# Patient Record
Sex: Male | Born: 2000
Health system: Southern US, Community
[De-identification: ages and names within clinical notes are randomized; demographics above are authoritative.]

## PROBLEM LIST (undated history)

## (undated) DIAGNOSIS — R42 Dizziness and giddiness: Secondary | ICD-10-CM

## (undated) DIAGNOSIS — F909 Attention-deficit hyperactivity disorder, unspecified type: Secondary | ICD-10-CM

## (undated) HISTORY — DX: Attention-deficit hyperactivity disorder, unspecified type: F90.9

## (undated) HISTORY — DX: Dizziness and giddiness: R42

---
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
## 2018-10-06 DIAGNOSIS — S43402A Unspecified sprain of left shoulder joint, initial encounter: Secondary | ICD-10-CM | POA: Diagnosis not present

## 2018-10-23 DIAGNOSIS — S46819A Strain of other muscles, fascia and tendons at shoulder and upper arm level, unspecified arm, initial encounter: Secondary | ICD-10-CM | POA: Diagnosis not present

## 2018-10-23 DIAGNOSIS — M545 Low back pain: Secondary | ICD-10-CM | POA: Diagnosis not present

## 2018-12-29 DIAGNOSIS — R509 Fever, unspecified: Secondary | ICD-10-CM | POA: Diagnosis not present

## 2018-12-29 DIAGNOSIS — R591 Generalized enlarged lymph nodes: Secondary | ICD-10-CM | POA: Diagnosis not present

## 2019-01-01 DIAGNOSIS — R509 Fever, unspecified: Secondary | ICD-10-CM | POA: Diagnosis not present

## 2019-01-01 DIAGNOSIS — R591 Generalized enlarged lymph nodes: Secondary | ICD-10-CM | POA: Diagnosis not present

## 2019-01-31 IMAGING — CT CT CHEST W/ CM
2 of 5 series · 12 of 36 positions shown, 15 images · IV contrast (Omni 300)
Comparison: None.

CLINICAL DATA: Level 1 trauma. Patient fell from skateboard.
Bleeding from the left ear and nose.

EXAM:
CT CHEST, ABDOMEN, AND PELVIS WITH CONTRAST
TECHNIQUE: Multidetector CT imaging of the chest, abdomen and pelvis was
performed following the standard protocol during bolus
administration of intravenous contrast.
CONTRAST:  100 mL Isovue 370

[Series 1: cap with 5mm st · axial · 0.77mm/px · z∈[-879,-304]mm · 9 of 141 slices shown, 12 images]
[im 13/141  mediastinal]
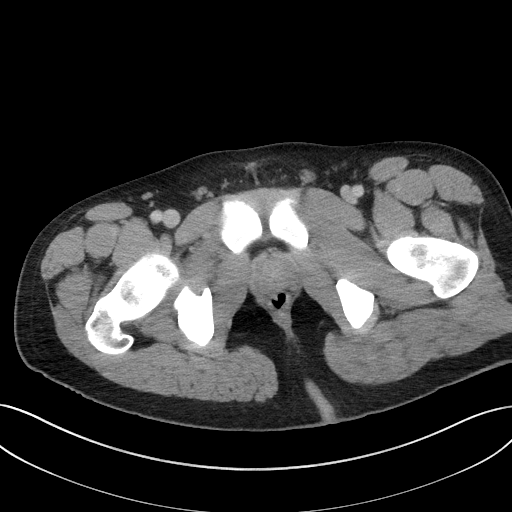
[im 13/141  lung]
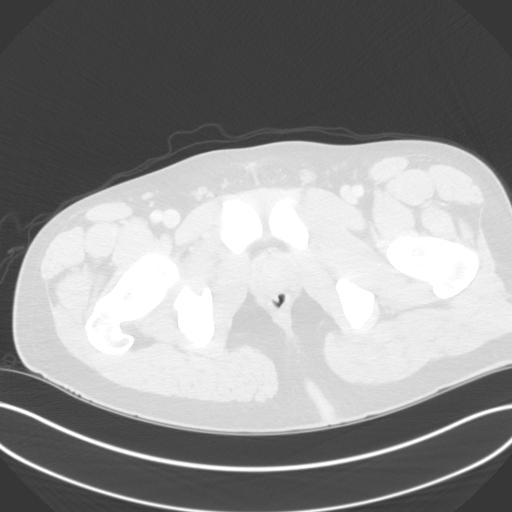
[im 26/141  lung]
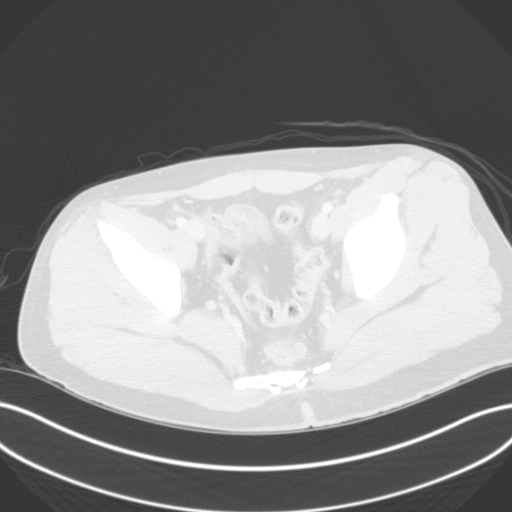
[im 39/141  lung]
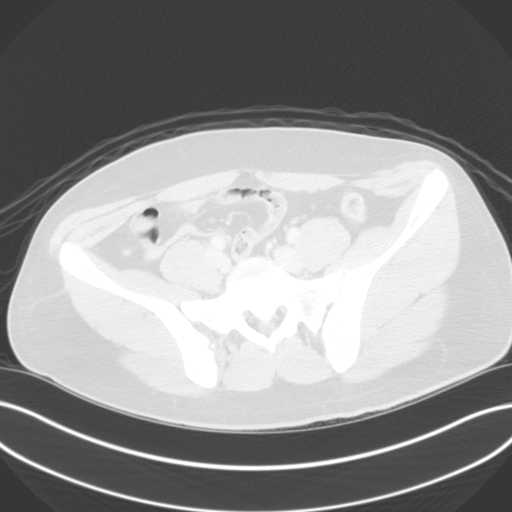
[im 51/141  lung]
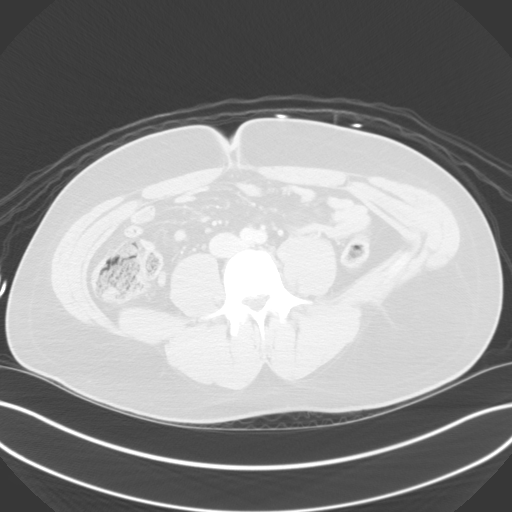
[im 77/141  mediastinal]
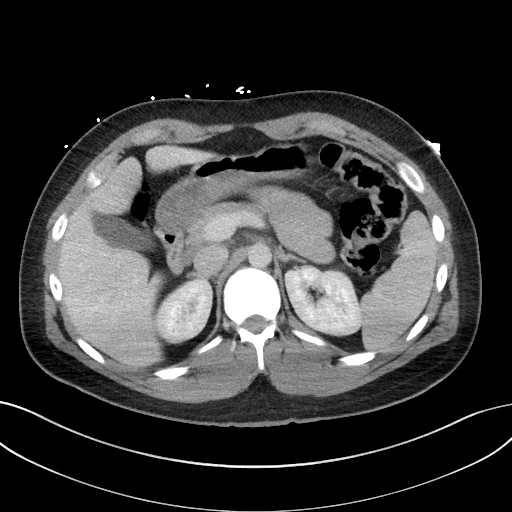
[im 77/141  lung]
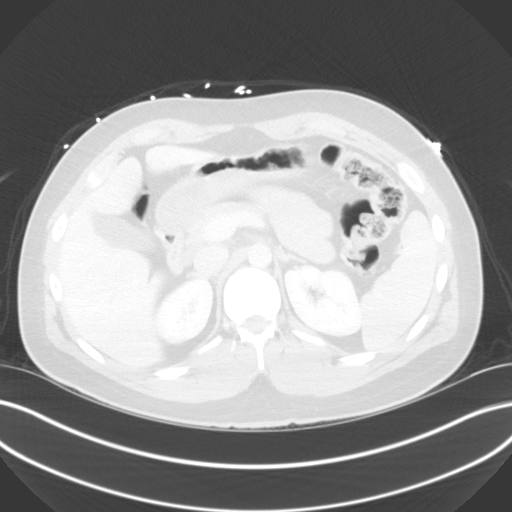
[im 90/141  lung]
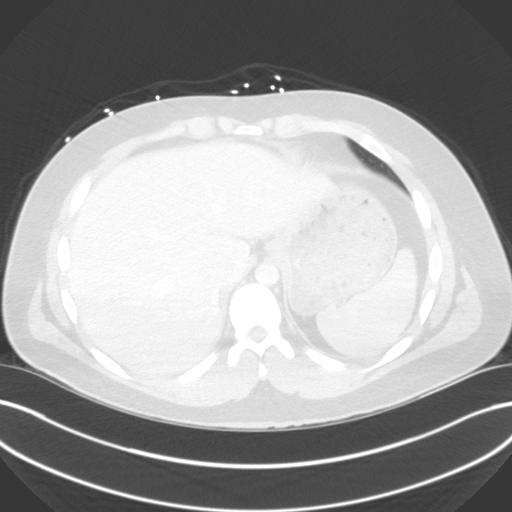
[im 102/141  lung]
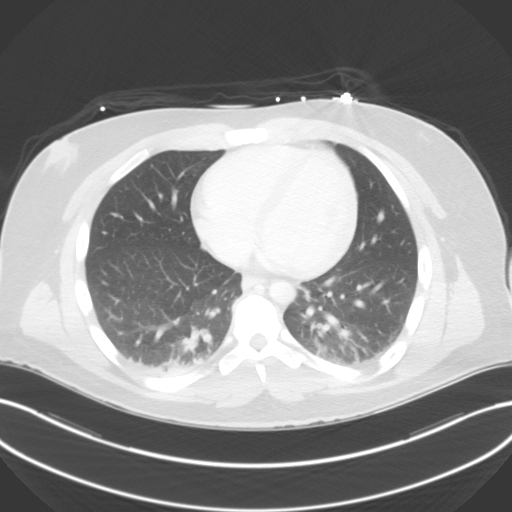
[im 115/141  lung]
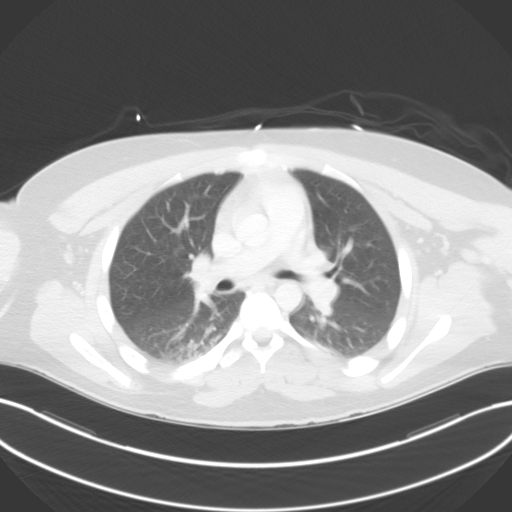
[im 128/141  mediastinal]
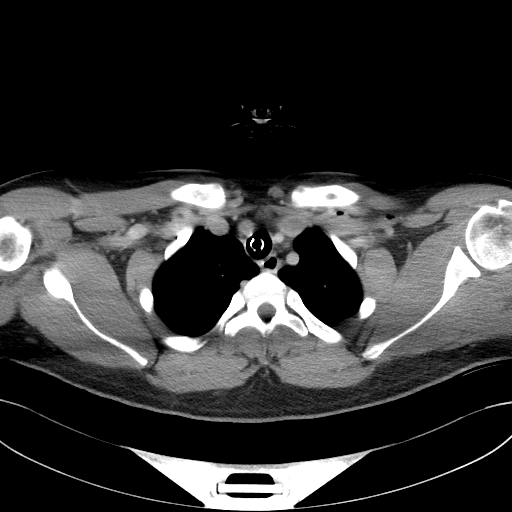
[im 128/141  lung]
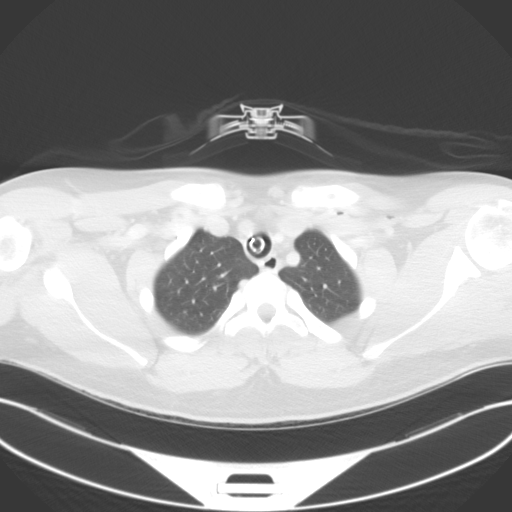

[Series 5: cap with 3mm st cor · coronal · 0.71mm/px · 3 of 140 slices shown]
[im 28/140  lung]
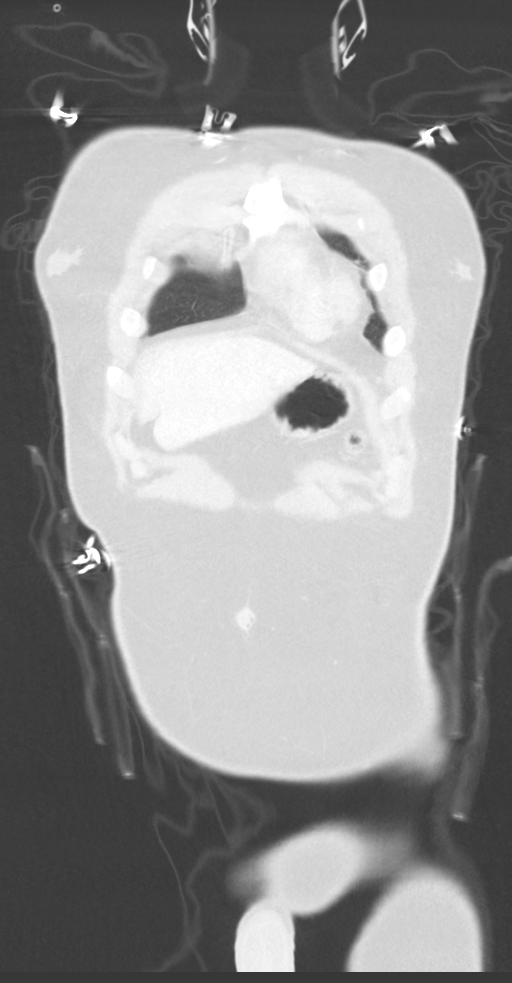
[im 56/140  lung]
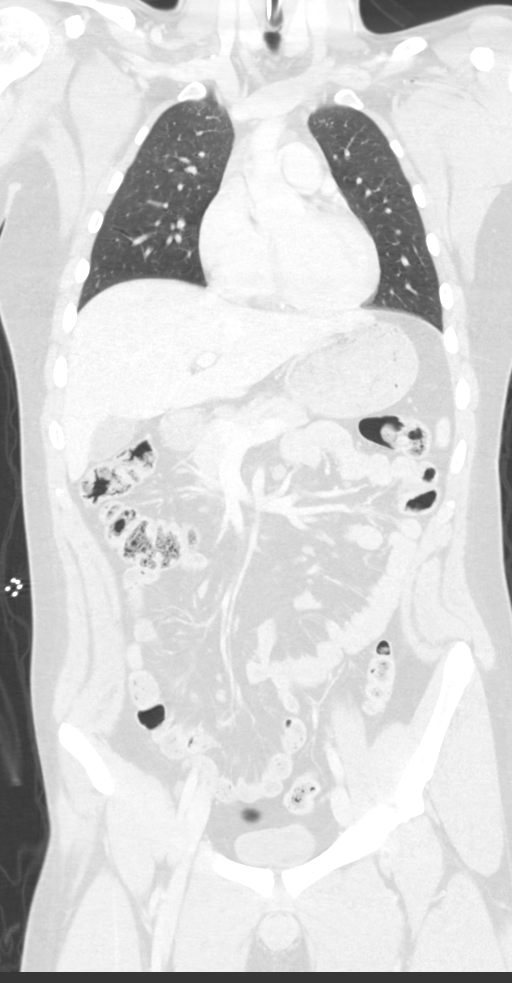
[im 84/140  lung]
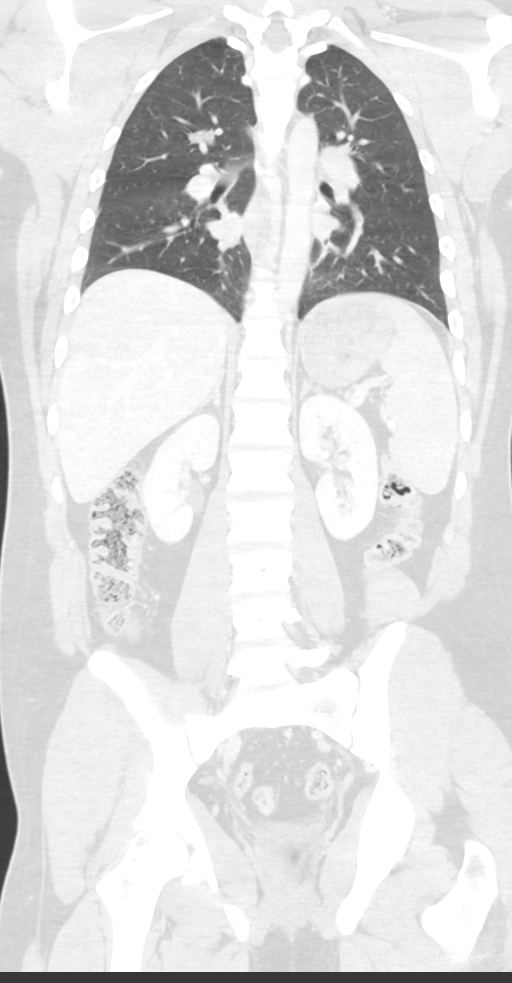

[12 of 36 positions shown; findings below may reference images not displayed]

FINDINGS: CT CHEST FINDINGS

Cardiovascular: Normal heart size. No pericardial effusion. Normal
caliber thoracic aorta. Contrast bolus is limited but no evidence of
aortic injury. Venous gas in the left subclavian region likely
results from intravenous injections.

Mediastinum/Nodes: Increased density in the mediastinum likely
representing residual thymic tissue. No abnormal mediastinal gas or
hematoma. Esophagus is decompressed. No significant lymphadenopathy.
Endotracheal tube tip is above the carina.

Lungs/Pleura: Vague patchy airspace disease demonstrated in the
right upper lung with mild consolidation and atelectasis in the lung
bases. These changes could indicate pulmonary contusion or
aspiration. No pleural effusions. No pneumothorax. Airways are
patent.

Musculoskeletal: Normal alignment of the thoracic spine. No
vertebral compression deformities. No depressed rib or sternal
fractures.

CT ABDOMEN PELVIS FINDINGS

Hepatobiliary: No hepatic injury or perihepatic hematoma.
Gallbladder is unremarkable

Pancreas: Unremarkable. No pancreatic ductal dilatation or
surrounding inflammatory changes.

Spleen: No splenic injury or perisplenic hematoma.

Adrenals/Urinary Tract: No adrenal hemorrhage or renal injury
identified. Bladder is unremarkable.

Stomach/Bowel: Stomach is within normal limits. Appendix appears
normal. No evidence of bowel wall thickening, distention, or
inflammatory changes.

Vascular/Lymphatic: No significant vascular findings are present. No
enlarged abdominal or pelvic lymph nodes.

Reproductive: Prostate is unremarkable.

Other: No free air or free fluid in the abdomen. No mesenteric or
retroperitoneal hematomas. Abdominal wall musculature appears
intact.

Musculoskeletal: Normal alignment of the lumbar spine. No vertebral
compression deformities. Sacrum, pelvis, and hips appear intact.
IMPRESSION: 1. Mild patchy airspace disease in the right upper lung with
atelectasis or consolidation in both lung bases. In the setting of
trauma, this could represent pulmonary contusion or aspiration.
2. No acute mediastinal injury.
3. No acute posttraumatic changes demonstrated in the abdomen or
pelvis. No evidence of solid organ injury or bowel perforation.
4. No acute fractures identified.

## 2019-02-03 IMAGING — CT CT HEAD W/O CM
4 series · 15 of 47 positions shown, 17 images · non-contrast
Comparison: CT HEAD May 13, 2017 and CT face May 13, 2017

CLINICAL DATA: Followup head trauma and hearing loss. Known LEFT
temporal skull fracture.

EXAM:
CT HEAD WITHOUT CONTRAST
TECHNIQUE: Contiguous axial images were obtained from the base of the skull
through the vertex without intravenous contrast.

[Series 3: head wo · axial · 0.40mm/px · z∈[+209,+319]mm · 7 of 30 slices shown, 9 images]
[im 4/30  brain]
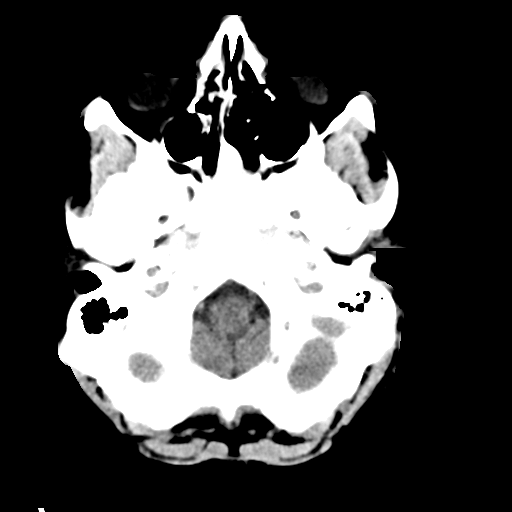
[im 4/30  bone]
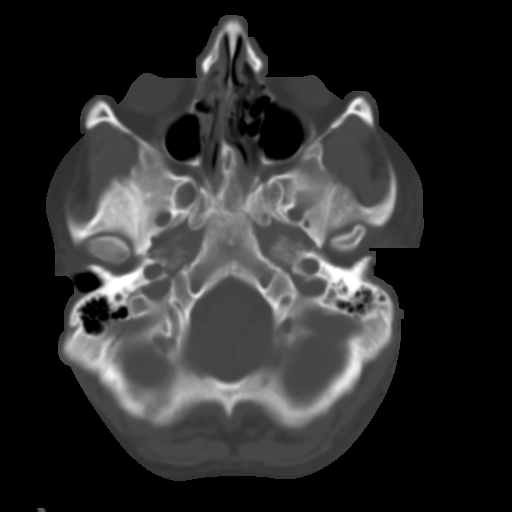
[im 8/30  brain]
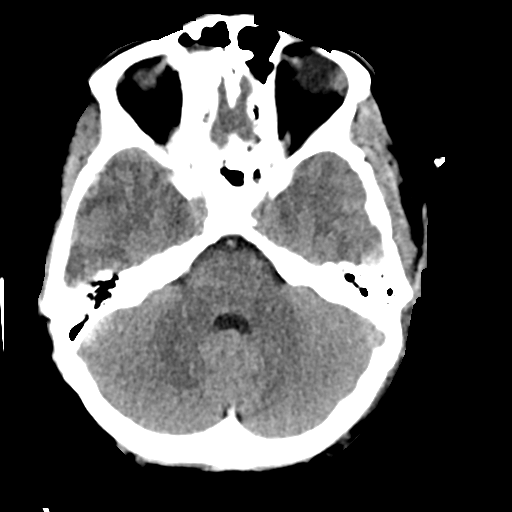
[im 11/30  brain]
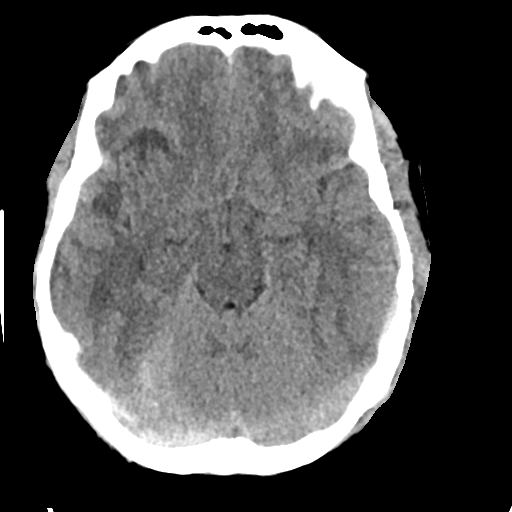
[im 15/30  brain]
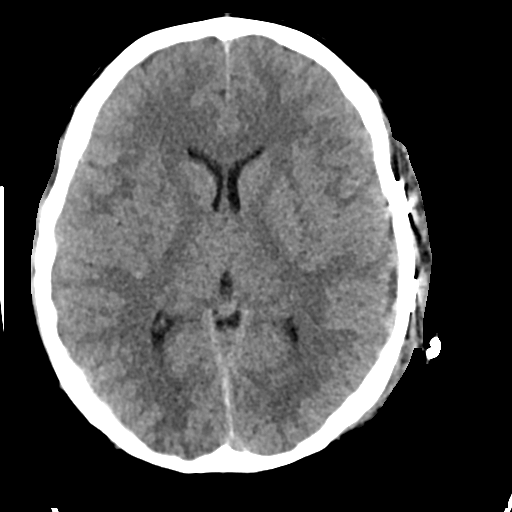
[im 19/30  brain]
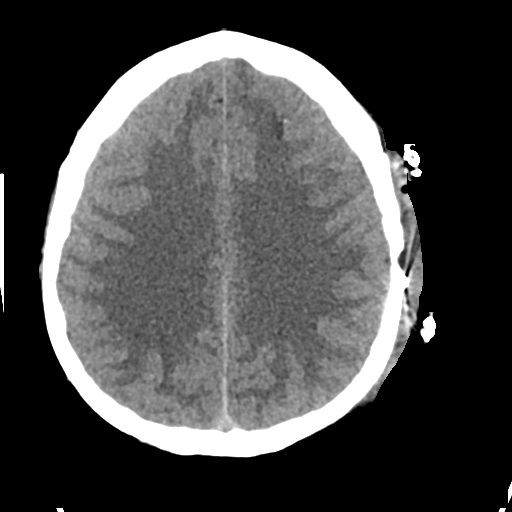
[im 19/30  bone]
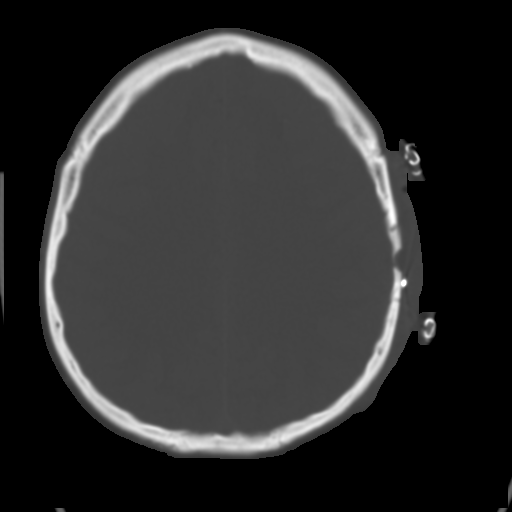
[im 22/30  brain]
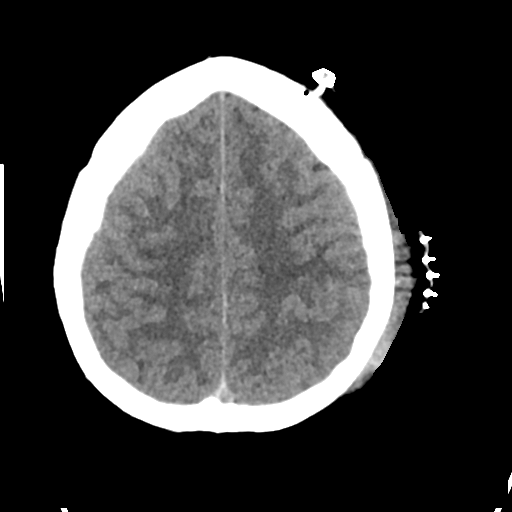
[im 26/30  brain]
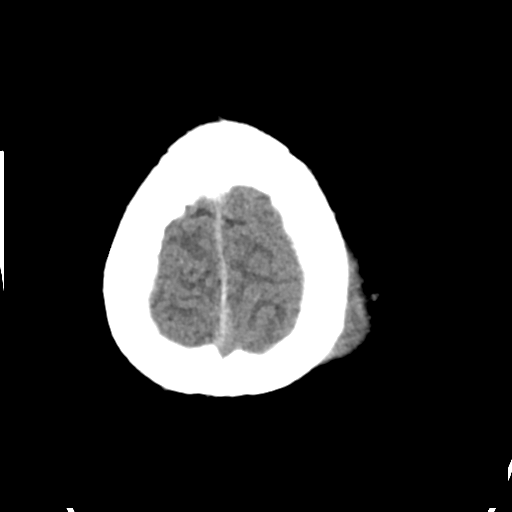

[Series 4: head bone · axial · 0.40mm/px · z∈[+208,+222]mm · 2 of 74 slices shown]
[im 8/74  bone]
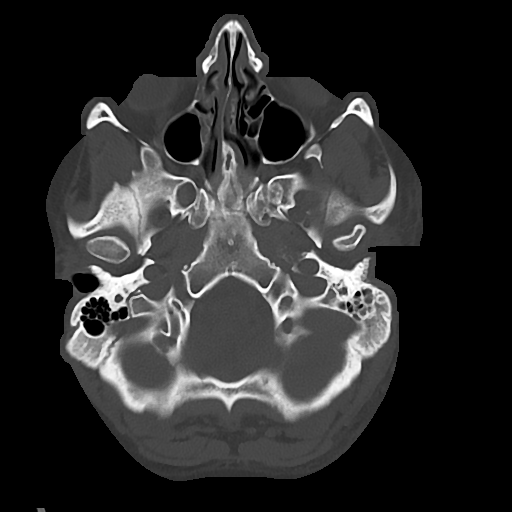
[im 15/74  bone]
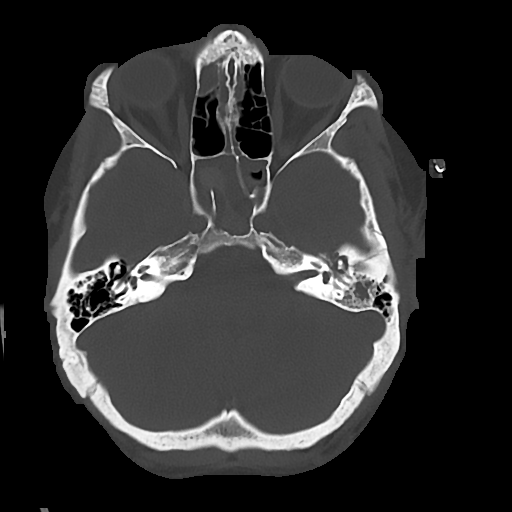

[Series 5: cor soft · coronal · 0.29mm/px · 3 of 68 slices shown]
[im 23/68  brain]
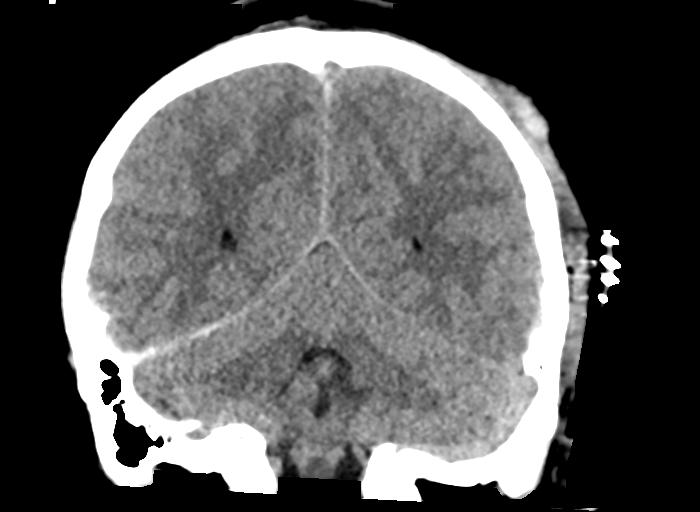
[im 30/68  brain]
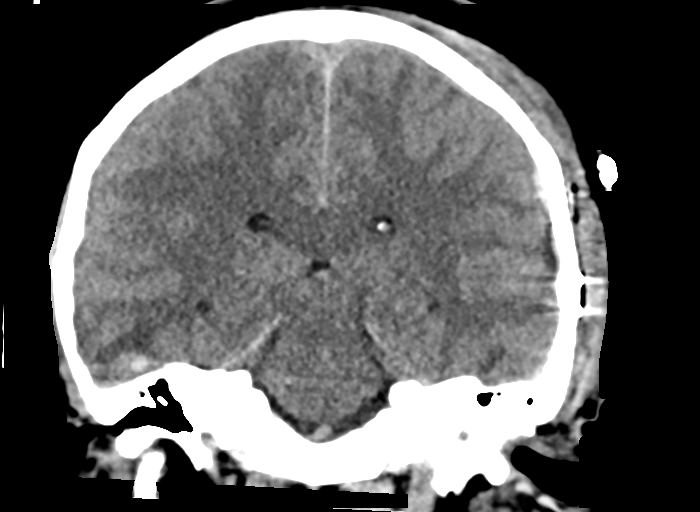
[im 38/68  brain]
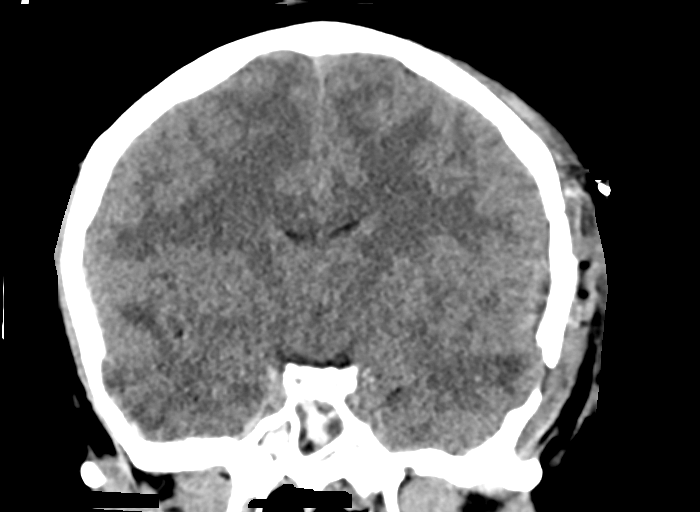

[Series 6: sag soft · sagittal · 0.29mm/px · 3 of 60 slices shown]
[im 20/60  brain]
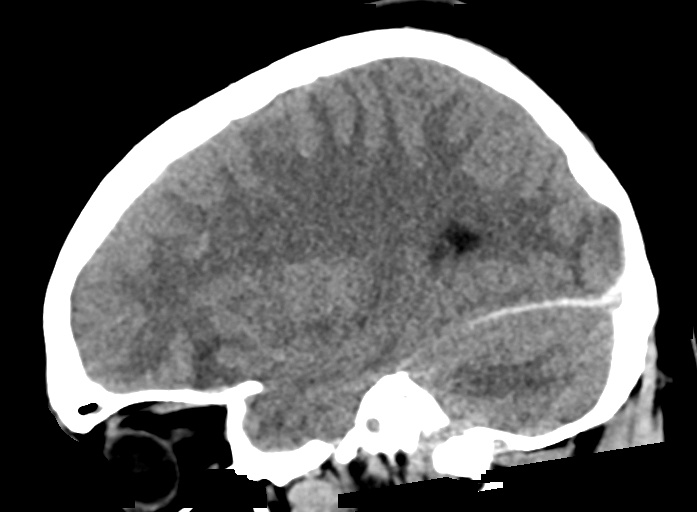
[im 30/60  brain]
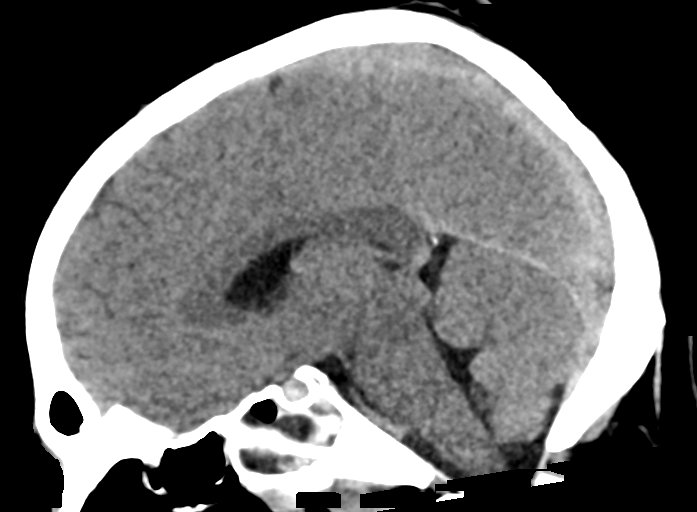
[im 40/60  brain]
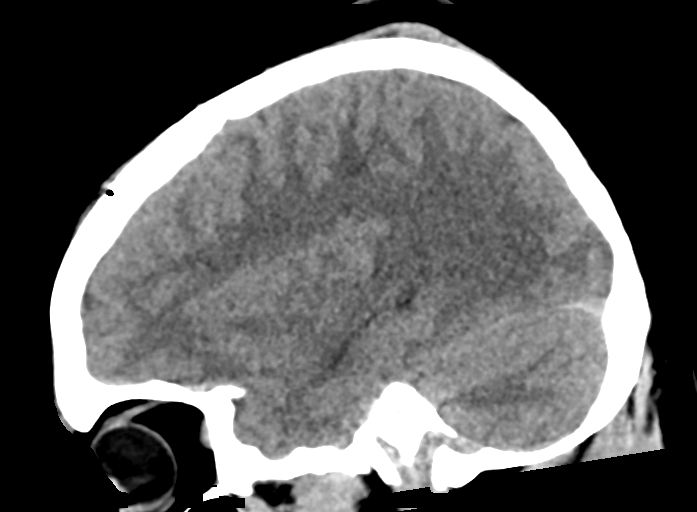

[15 of 47 positions shown; findings below may reference images not displayed]

FINDINGS: BRAIN: Interval evacuation of epidural hematoma with 3 mm residual
LEFT temporoparietal extra-axial blood products. Trace subdural
hematoma RIGHT cerebellar tentorium. Evolving RIGHT greater than
LEFT frontotemporal lobe hemorrhagic contusions. No hydrocephalus.
No acute large vascular territory infarcts. Basal cisterns are
patent.

VASCULAR: Unremarkable.

SKULL/SOFT TISSUES: Large LEFT scalp hematoma with subcutaneous gas
and skin staples. Status post interim LEFT frontotemporal
craniotomy. Tiny LEFT frontal burr hole for ICP monitor which is
been removed. Small underlying edema. Re- demonstration of LEFT
complex temporoparietal bone fracture extending through the
squamosal segment and, mastoid segment at the level of the inner ear
and tympanic plate. Persistent soft tissue effacement LEFT external
auditory canal, LEFT middle ear and to lesser extent LEFT mastoid
aircells. Nondisplaced central skullbase fracture through sphenoid
body into sella floor and RIGHT planum sphenoidale.

ORBITS/SINUSES: Persistent sphenoid the hemosinus.

OTHER: None.
IMPRESSION: 1. Interval LEFT craniotomy for evacuation of subdural hematoma with
minimal residual blood products.
2. Evolving bifrontotemporal hemorrhagic contusions. Trace residual
RIGHT cerebellar tentorium subdural hematoma.
3. Re- demonstration of complex LEFT temporoparietal skull fracture
extending through the inner ear with hemotympanum. Temporal bone CT
could be performed to assess for ossicular chain disruption.
4. Central skullbase fracture with sphenoid hemosinus, risk for CSF
leak.

## 2019-09-26 ENCOUNTER — Ambulatory Visit: Payer: BLUE CROSS/BLUE SHIELD | Attending: Internal Medicine

## 2019-09-26 DIAGNOSIS — Z20822 Contact with and (suspected) exposure to covid-19: Secondary | ICD-10-CM

## 2019-09-27 LAB — NOVEL CORONAVIRUS, NAA: SARS-CoV-2, NAA: DETECTED — AB

## 2019-12-12 DIAGNOSIS — A084 Viral intestinal infection, unspecified: Secondary | ICD-10-CM | POA: Diagnosis not present

## 2019-12-12 DIAGNOSIS — R1110 Vomiting, unspecified: Secondary | ICD-10-CM | POA: Diagnosis not present

## 2019-12-12 DIAGNOSIS — R110 Nausea: Secondary | ICD-10-CM | POA: Diagnosis not present

## 2019-12-12 DIAGNOSIS — Z20828 Contact with and (suspected) exposure to other viral communicable diseases: Secondary | ICD-10-CM | POA: Diagnosis not present

## 2020-01-07 ENCOUNTER — Ambulatory Visit: Payer: BLUE CROSS/BLUE SHIELD | Attending: Internal Medicine

## 2020-01-07 DIAGNOSIS — Z23 Encounter for immunization: Secondary | ICD-10-CM

## 2020-01-07 NOTE — Progress Notes (Signed)
   Covid-19 Vaccination Clinic  Name:  Jesus Blankenship    MRN: [id number] DOB: Sep 08, 2000  01/07/2020  Jesus Blankenship was observed post Covid-19 immunization for 15 minutes without incident. He was provided with Vaccine Information Sheet and instruction to access the V-Safe system.   Jesus Blankenship was instructed to call 911 with any severe reactions post vaccine: Marland Kitchen Difficulty breathing  . Swelling of face and throat  . A fast heartbeat  . A bad rash all over body  . Dizziness and weakness   Immunizations Administered    Name Date Dose VIS Date Route   Pfizer COVID-19 Vaccine 01/07/2020 12:20 PM 0.3 mL 10/17/2018 Intramuscular   Manufacturer: [employer], [employer]   Lot: [id number]   NDC: [address]-2

## 2020-01-28 ENCOUNTER — Ambulatory Visit: Payer: BLUE CROSS/BLUE SHIELD | Attending: Internal Medicine

## 2020-01-28 DIAGNOSIS — Z23 Encounter for immunization: Secondary | ICD-10-CM

## 2020-01-28 NOTE — Progress Notes (Signed)
° °  Covid-19 Vaccination Clinic  Name:  Jesus Blankenship    MRN: [id number] DOB: 08-14-2001  01/28/2020  [name] was observed post Covid-19 immunization for 15 minutes without incident. He was provided with Vaccine Information Sheet and instruction to access the V-Safe system.   [name] was instructed to call 911 with any severe reactions post vaccine:  Difficulty breathing   Swelling of face and throat   A fast heartbeat   A bad rash all over body   Dizziness and weakness   Immunizations Administered    Name Date Dose VIS Date Route   Pfizer COVID-19 Vaccine 01/28/2020  2:40 PM 0.3 mL 10/17/2018 Intramuscular   Manufacturer: [employer], [employer]   Lot: [id number]   NDC: [address]-1

## 2020-01-30 DIAGNOSIS — R5381 Other malaise: Secondary | ICD-10-CM | POA: Diagnosis not present

## 2020-01-30 DIAGNOSIS — Z20828 Contact with and (suspected) exposure to other viral communicable diseases: Secondary | ICD-10-CM | POA: Diagnosis not present

## 2020-01-30 DIAGNOSIS — R110 Nausea: Secondary | ICD-10-CM | POA: Diagnosis not present

## 2020-02-08 DIAGNOSIS — R42 Dizziness and giddiness: Secondary | ICD-10-CM | POA: Diagnosis not present

## 2020-02-08 DIAGNOSIS — R110 Nausea: Secondary | ICD-10-CM | POA: Diagnosis not present

## 2020-02-11 DIAGNOSIS — S069X9S Unspecified intracranial injury with loss of consciousness of unspecified duration, sequela: Secondary | ICD-10-CM | POA: Diagnosis not present

## 2020-04-08 ENCOUNTER — Encounter: Payer: Self-pay | Admitting: *Deleted

## 2020-04-09 ENCOUNTER — Ambulatory Visit: Payer: BC Managed Care – PPO | Admitting: Diagnostic Neuroimaging

## 2020-04-09 ENCOUNTER — Other Ambulatory Visit: Payer: Self-pay

## 2020-04-09 ENCOUNTER — Encounter: Payer: Self-pay | Admitting: Diagnostic Neuroimaging

## 2020-04-09 VITALS — BP 123/77 | HR 67 | Ht 72.0 in | Wt 274.0 lb

## 2020-04-09 DIAGNOSIS — S069X1S Unspecified intracranial injury with loss of consciousness of 30 minutes or less, sequela: Secondary | ICD-10-CM | POA: Diagnosis not present

## 2020-04-09 NOTE — Patient Instructions (Signed)
TBI / left temporal epidural hematoma (2018; fell from skateboard; s/p craniotomy) - intermittent zone out spells (ADHD vs daydreaming; not likely seizure); consider video recording of spells; have family / friend tap on should to see if spell can be stopped - intermittent vertigo (post-traumatic; left temporal bone fracture; left hearing loss has improved) - monitor; continue supportive care

## 2020-04-09 NOTE — Progress Notes (Signed)
[facility]  PATIENT: Jesus Blankenship DOB: Aug 11, 2001  REFERRING CLINICIAN: Elias Else, MD HISTORY FROM: patient  REASON FOR VISIT: new consult    HISTORICAL  CHIEF COMPLAINT:  Chief Complaint  Patient presents with  . Hx of TBI    rm 6 New Pt "random spells of possibly vertigo, space out a lot; confused more than usual with my ADHD"    HISTORY OF PRESENT ILLNESS:   19 year old male here for evaluation of traumatic brain injury.  In 05/12/2017 patient was skateboarding, lost balance and fell down.  He was not wearing a helmet.  He lost consciousness and was taken to the hospital for evaluation.  He was found to have left temporal epidural hematoma requiring craniotomy and surgical evacuation.  Patient was discharged on 05/18/2017.  Since that time patient has recovered almost completely.  Occasionally he has some zoning out and spacing out spells.  He does have history of ADHD since elementary school, previously on medication.  No seizures or convulsions.   REVIEW OF SYSTEMS: Full 14 system review of systems performed and negative with exception of: As per HPI.  ALLERGIES: No Known Allergies  HOME MEDICATIONS: Outpatient Medications Prior to Visit  Medication Sig Dispense Refill  . methocarbamol (ROBAXIN) 750 MG tablet Take 1 tablet (750 mg total) by mouth every 8 (eight) hours as needed for muscle spasms (back pain). 60 tablet 0  . oxyCODONE (OXY IR/ROXICODONE) 5 MG immediate release tablet Take 1 tablet (5 mg total) by mouth every 4 (four) hours as needed for moderate pain or severe pain. 30 tablet 0   No facility-administered medications prior to visit.    PAST MEDICAL HISTORY: Past Medical History:  Diagnosis Date  . ADHD   . TBI (traumatic brain injury) (HCC) 04/2017   w/LOC  . Vertigo     PAST SURGICAL HISTORY: Past Surgical History:  Procedure Laterality Date  . CRANIOTOMY Right 05/13/2017   Procedure: CRANIOTOMY HEMATOMA  EVACUATION EPIDURAL ;  Surgeon: Shirlean Kelly, MD;  Location[facility] OR;  Service: Neurosurgery;  Laterality: Right;  . VENTRICULOSTOMY  05/13/2017   Procedure: Insertion of Intracranial pressure monitor;  Surgeon: Shirlean Kelly, MD;  Location: [facility] OR;  Service: Neurosurgery;;    FAMILY HISTORY: No family history on file.  SOCIAL HISTORY: Social History   Socioeconomic History  . Marital status: Single    Spouse name: Not on file  . Number of children: Not on file  . Years of education: Not on file  . Highest education level: High school graduate  Occupational History    Comment: NA  Tobacco Use  . Smoking status: Current Some Day Smoker  . Smokeless tobacco: Never Used  Vaping Use  . Vaping Use: Every day  Substance and Sexual Activity  . Alcohol use: Yes    Comment: occas  . Drug use: Not Currently  . Sexual activity: Not on file  Other Topics Concern  . Not on file  Social History Narrative   Caffeine- sodas all day   Social Determinants of Health   Financial Resource Strain:   . Difficulty of Paying Living Expenses:   Food Insecurity:   . Worried About Programme researcher, broadcasting/film/video in the Last Year:   . Barista in the Last Year:   Transportation Needs:   . Freight forwarder (Medical):   Marland Kitchen Lack of Transportation (Non-Medical):   Physical Activity:   . Days of Exercise per Week:   . Minutes of  Exercise per Session:   Stress:   . Feeling of Stress :   Social Connections:   . Frequency of Communication with Friends and Family:   . Frequency of Social Gatherings with Friends and Family:   . Attends Religious Services:   . Active Member of Clubs or Organizations:   . Attends Banker Meetings:   Marland Kitchen Marital Status:   Intimate Partner Violence:   . Fear of Current or Ex-Partner:   . Emotionally Abused:   Marland Kitchen Physically Abused:   . Sexually Abused:      PHYSICAL EXAM  GENERAL EXAM/CONSTITUTIONAL: Vitals:  Vitals:   04/09/20 1009  BP: 123/77   Pulse: 67  Weight: 274 lb (124.3 kg)  Height: 6' (1.829 m)     Body mass index is 37.16 kg/m. Wt Readings from Last 3 Encounters:  04/09/20 274 lb (124.3 kg)  05/13/17 220 lb (99.8 kg) (97 %, Z= 1.96)*   * Growth percentiles are based on CDC (Boys, 2-20 Years) data.     Patient is in no distress; well developed, nourished and groomed; neck is supple  CARDIOVASCULAR:  Examination of carotid arteries is normal; no carotid bruits  Regular rate and rhythm, no murmurs  Examination of peripheral vascular system by observation and palpation is normal  EYES:  Ophthalmoscopic exam of optic discs and posterior segments is normal; no papilledema or hemorrhages  No exam data present  MUSCULOSKELETAL:  Gait, strength, tone, movements noted in Neurologic exam below  NEUROLOGIC: MENTAL STATUS:  No flowsheet data found.  awake, alert, oriented to person, place and time  recent and remote memory intact  normal attention and concentration  language fluent, comprehension intact, naming intact  fund of knowledge appropriate  CRANIAL NERVE:   2nd - no papilledema on fundoscopic exam  2nd, 3rd, 4th, 6th - pupils equal and reactive to light, visual fields full to confrontation, extraocular muscles intact, no nystagmus  5th - facial sensation symmetric  7th - facial strength symmetric  8th - hearing intact  9th - palate elevates symmetrically, uvula midline  11th - shoulder shrug symmetric  12th - tongue protrusion midline  MOTOR:   normal bulk and tone, full strength in the BUE, BLE  SENSORY:   normal and symmetric to light touch, temperature, vibration  COORDINATION:   finger-nose-finger, fine finger movements normal  REFLEXES:   deep tendon reflexes present and symmetric  GAIT/STATION:   narrow based gait     DIAGNOSTIC DATA (LABS, IMAGING, TESTING) - I reviewed patient records, labs, notes, testing and imaging myself where available.  Lab  Results  Component Value Date   WBC 8.5 05/18/2017   HGB 12.9 (L) 05/18/2017   HCT 37.9 (L) 05/18/2017   MCV 77.8 (L) 05/18/2017   PLT 259 05/18/2017      Component Value Date/Time   NA 139 05/13/2017 0529   K 3.9 05/13/2017 0529   CL 111 05/13/2017 0529   CO2 22 05/13/2017 0529   GLUCOSE 126 (H) 05/13/2017 0529   BUN 8 05/13/2017 0529   CREATININE 0.89 05/13/2017 0529   CALCIUM 8.1 (L) 05/13/2017 0529   PROT 5.7 (L) 05/13/2017 0529   ALBUMIN 3.7 05/13/2017 0529   AST 23 05/13/2017 0529   ALT 18 05/13/2017 0529   ALKPHOS 69 05/13/2017 0529   BILITOT 0.9 05/13/2017 0529   GFRNONAA >60 05/13/2017 0529   GFRAA >60 05/13/2017 0529   Lab Results  Component Value Date   TRIG 64 05/13/2017  No results found for: HGBA1C No results found for: VITAMINB12 No results found for: TSH  05/13/17 CTA head [I reviewed images myself and agree with interpretation. -VRP]  1. Left parietotemporal skullbase fracture with associated epidural hematoma adjacent to the anterior left temporal lobe. 2. Contrecoup position of anterior right temporal subdural hematoma and small amount of subarachnoid blood in the sylvian fissures. 3. Effaced third ventricle and basal cisterns secondary to cerebral edema and mass effect. No herniation currently. 4. Mild asymmetric narrowing of the left internal carotid artery lacerum and proximal cavernous segments may indicate grade 1 blunt cerebrovascular injury. No other findings to suggest acute carotid or vertebral vascular injury. 5. Please see dedicated maxillofacial CT report for more complete discussion of the temporal bone fracture.  07/04/17 CT head [I reviewed images myself and agree with interpretation. -VRP]  1. Interval clearing of extra-axial hemorrhage. 2. Normal CT appearance of the brain. 3. Nondisplaced left extra capsular left temporal fracture. 4. Left temporal craniotomy. 5. No significant sinus disease.   ASSESSMENT AND PLAN  19  y.o. year old male here with: Dx:  1. Traumatic brain injury, with loss of consciousness of 30 minutes or less, sequela (HCC)       PLAN:  TBI / left temporal epidural hematoma (2018; fell from skateboard; s/p craniotomy) - intermittent zone out spells (ADHD vs daydreaming; not likely seizure); consider video recording of spells; have family / friend tap on should to see if spell can be stopped - intermittent vertigo (post-traumatic; left temporal bone fracture; left hearing loss has improved) - able to maintain all ADLs; currently on job search - monitor; continue supportive care  Return for pending if symptoms worsen or fail to improve, return to PCP.    Suanne Marker, MD 04/09/2020, 11:00 AM Certified in Neurology, Neurophysiology and Neuroimaging  [facility] [address], [address] [place], [place] [address] [phone]

## 2020-04-11 ENCOUNTER — Encounter: Payer: Self-pay | Admitting: Diagnostic Neuroimaging

## 2020-08-17 DIAGNOSIS — R051 Acute cough: Secondary | ICD-10-CM | POA: Diagnosis not present

## 2020-08-17 DIAGNOSIS — R0981 Nasal congestion: Secondary | ICD-10-CM | POA: Diagnosis not present

## 2020-08-17 DIAGNOSIS — Z20822 Contact with and (suspected) exposure to covid-19: Secondary | ICD-10-CM | POA: Diagnosis not present

## 2020-10-24 DIAGNOSIS — M9902 Segmental and somatic dysfunction of thoracic region: Secondary | ICD-10-CM | POA: Diagnosis not present

## 2020-10-24 DIAGNOSIS — M5417 Radiculopathy, lumbosacral region: Secondary | ICD-10-CM | POA: Diagnosis not present

## 2020-10-24 DIAGNOSIS — M9905 Segmental and somatic dysfunction of pelvic region: Secondary | ICD-10-CM | POA: Diagnosis not present

## 2020-10-24 DIAGNOSIS — M9903 Segmental and somatic dysfunction of lumbar region: Secondary | ICD-10-CM | POA: Diagnosis not present

## 2020-10-27 DIAGNOSIS — M9902 Segmental and somatic dysfunction of thoracic region: Secondary | ICD-10-CM | POA: Diagnosis not present

## 2020-10-27 DIAGNOSIS — M5417 Radiculopathy, lumbosacral region: Secondary | ICD-10-CM | POA: Diagnosis not present

## 2020-10-27 DIAGNOSIS — M9905 Segmental and somatic dysfunction of pelvic region: Secondary | ICD-10-CM | POA: Diagnosis not present

## 2020-10-27 DIAGNOSIS — M9903 Segmental and somatic dysfunction of lumbar region: Secondary | ICD-10-CM | POA: Diagnosis not present

## 2020-10-29 DIAGNOSIS — M9903 Segmental and somatic dysfunction of lumbar region: Secondary | ICD-10-CM | POA: Diagnosis not present

## 2020-10-29 DIAGNOSIS — M9905 Segmental and somatic dysfunction of pelvic region: Secondary | ICD-10-CM | POA: Diagnosis not present

## 2020-10-29 DIAGNOSIS — M5417 Radiculopathy, lumbosacral region: Secondary | ICD-10-CM | POA: Diagnosis not present

## 2020-10-29 DIAGNOSIS — M9902 Segmental and somatic dysfunction of thoracic region: Secondary | ICD-10-CM | POA: Diagnosis not present

## 2020-11-04 DIAGNOSIS — M9905 Segmental and somatic dysfunction of pelvic region: Secondary | ICD-10-CM | POA: Diagnosis not present

## 2020-11-04 DIAGNOSIS — M9902 Segmental and somatic dysfunction of thoracic region: Secondary | ICD-10-CM | POA: Diagnosis not present

## 2020-11-04 DIAGNOSIS — M5417 Radiculopathy, lumbosacral region: Secondary | ICD-10-CM | POA: Diagnosis not present

## 2020-11-04 DIAGNOSIS — M9903 Segmental and somatic dysfunction of lumbar region: Secondary | ICD-10-CM | POA: Diagnosis not present

## 2020-11-13 DIAGNOSIS — M9902 Segmental and somatic dysfunction of thoracic region: Secondary | ICD-10-CM | POA: Diagnosis not present

## 2020-11-13 DIAGNOSIS — M9905 Segmental and somatic dysfunction of pelvic region: Secondary | ICD-10-CM | POA: Diagnosis not present

## 2020-11-13 DIAGNOSIS — M9903 Segmental and somatic dysfunction of lumbar region: Secondary | ICD-10-CM | POA: Diagnosis not present

## 2020-11-13 DIAGNOSIS — M5417 Radiculopathy, lumbosacral region: Secondary | ICD-10-CM | POA: Diagnosis not present

## 2021-01-26 DIAGNOSIS — S29012A Strain of muscle and tendon of back wall of thorax, initial encounter: Secondary | ICD-10-CM | POA: Diagnosis not present

## 2021-05-05 DIAGNOSIS — R0602 Shortness of breath: Secondary | ICD-10-CM | POA: Diagnosis not present

## 2021-05-05 DIAGNOSIS — R0981 Nasal congestion: Secondary | ICD-10-CM | POA: Diagnosis not present

## 2021-05-05 DIAGNOSIS — Z20822 Contact with and (suspected) exposure to covid-19: Secondary | ICD-10-CM | POA: Diagnosis not present

## 2021-08-31 DIAGNOSIS — R519 Headache, unspecified: Secondary | ICD-10-CM | POA: Diagnosis not present

## 2021-08-31 DIAGNOSIS — R5383 Other fatigue: Secondary | ICD-10-CM | POA: Diagnosis not present

## 2021-08-31 DIAGNOSIS — Z20822 Contact with and (suspected) exposure to covid-19: Secondary | ICD-10-CM | POA: Diagnosis not present

## 2022-03-10 DIAGNOSIS — R5383 Other fatigue: Secondary | ICD-10-CM | POA: Diagnosis not present

## 2022-03-10 DIAGNOSIS — R6882 Decreased libido: Secondary | ICD-10-CM | POA: Diagnosis not present

## 2022-03-10 DIAGNOSIS — F909 Attention-deficit hyperactivity disorder, unspecified type: Secondary | ICD-10-CM | POA: Diagnosis not present

## 2022-03-10 DIAGNOSIS — B070 Plantar wart: Secondary | ICD-10-CM | POA: Diagnosis not present

## 2022-03-10 DIAGNOSIS — F418 Other specified anxiety disorders: Secondary | ICD-10-CM | POA: Diagnosis not present

## 2022-04-15 DIAGNOSIS — E291 Testicular hypofunction: Secondary | ICD-10-CM | POA: Diagnosis not present

## 2022-04-15 DIAGNOSIS — F418 Other specified anxiety disorders: Secondary | ICD-10-CM | POA: Diagnosis not present

## 2022-04-15 DIAGNOSIS — F909 Attention-deficit hyperactivity disorder, unspecified type: Secondary | ICD-10-CM | POA: Diagnosis not present

## 2022-07-19 DIAGNOSIS — Z0000 Encounter for general adult medical examination without abnormal findings: Secondary | ICD-10-CM | POA: Diagnosis not present

## 2022-07-19 DIAGNOSIS — Z23 Encounter for immunization: Secondary | ICD-10-CM | POA: Diagnosis not present

## 2022-07-19 DIAGNOSIS — Z13220 Encounter for screening for lipoid disorders: Secondary | ICD-10-CM | POA: Diagnosis not present

## 2022-07-19 DIAGNOSIS — F909 Attention-deficit hyperactivity disorder, unspecified type: Secondary | ICD-10-CM | POA: Diagnosis not present

## 2022-07-19 DIAGNOSIS — E291 Testicular hypofunction: Secondary | ICD-10-CM | POA: Diagnosis not present

## 2022-07-19 DIAGNOSIS — Z6837 Body mass index (BMI) 37.0-37.9, adult: Secondary | ICD-10-CM | POA: Diagnosis not present

## 2022-07-19 DIAGNOSIS — F418 Other specified anxiety disorders: Secondary | ICD-10-CM | POA: Diagnosis not present

## 2022-07-19 DIAGNOSIS — Z125 Encounter for screening for malignant neoplasm of prostate: Secondary | ICD-10-CM | POA: Diagnosis not present

## 2022-10-25 DIAGNOSIS — F418 Other specified anxiety disorders: Secondary | ICD-10-CM | POA: Diagnosis not present

## 2022-10-25 DIAGNOSIS — E291 Testicular hypofunction: Secondary | ICD-10-CM | POA: Diagnosis not present

## 2022-10-25 DIAGNOSIS — M549 Dorsalgia, unspecified: Secondary | ICD-10-CM | POA: Diagnosis not present

## 2022-11-09 DIAGNOSIS — B349 Viral infection, unspecified: Secondary | ICD-10-CM | POA: Diagnosis not present

## 2023-07-14 ENCOUNTER — Other Ambulatory Visit: Payer: Self-pay

## 2023-07-14 ENCOUNTER — Ambulatory Visit
Admission: EM | Admit: 2023-07-14 | Discharge: 2023-07-14 | Disposition: A | Discharge status: Home / Self Care | Payer: BC Managed Care – PPO | Attending: Family Medicine | Admitting: Family Medicine

## 2023-07-14 ENCOUNTER — Encounter: Payer: Self-pay | Admitting: Emergency Medicine

## 2023-07-14 ENCOUNTER — Telehealth: Payer: Self-pay

## 2023-07-14 DIAGNOSIS — J0100 Acute maxillary sinusitis, unspecified: Secondary | ICD-10-CM | POA: Diagnosis not present

## 2023-07-14 MED ORDER — AMOXICILLIN-POT CLAVULANATE 875-125 MG PO TABS: 1.0000 | ORAL_TABLET | Freq: Two times a day (BID) | ORAL | 0 refills | Status: DC

## 2023-07-14 MED ORDER — AMOXICILLIN-POT CLAVULANATE 875-125 MG PO TABS: 1.0000 | ORAL_TABLET | Freq: Two times a day (BID) | ORAL | 0 refills | Status: AC

## 2023-07-14 NOTE — ED Provider Notes (Signed)
[facility]    CSN: [id number] Arrival date & time: 07/14/23  1523      History   Chief Complaint Chief Complaint  Patient presents with   Nasal Congestion   Cough    HPI Jesus Blankenship is a 22 y.o. male.    Cough Associated symptoms: rhinorrhea    Patient is here for a doctor's note.  Patient has had URI symptoms x 6 days.  He is having a lot of congestion mostly, also in the chest.  At times it is a little hard to breath from his chest/sinuses.  He is having sinus pressure as well.  No fevers/chills.  Using otc meds with some help.  He did a home flu/covid test which was negative.        Past Medical History:  Diagnosis Date   ADHD    TBI (traumatic brain injury) (HCC) 04/2017   w/LOC   Vertigo     Patient Active Problem List   Diagnosis Date Noted   Blunt head injury 05/13/2017    Past Surgical History:  Procedure Laterality Date   CRANIOTOMY Right 05/13/2017   Procedure: CRANIOTOMY HEMATOMA EVACUATION EPIDURAL ;  Surgeon: Shirlean Kelly, MD;  Location: [facility] OR;  Service: Neurosurgery;  Laterality: Right;   VENTRICULOSTOMY  05/13/2017   Procedure: Insertion of Intracranial pressure monitor;  Surgeon: Shirlean Kelly, MD;  Location: [facility] OR;  Service: Neurosurgery;;       Home Medications    Prior to Admission medications   Medication Sig Start Date End Date Taking? Authorizing Provider  DM-Doxylamine-Acetaminophen (NYQUIL COLD & FLU PO) Take by mouth.   Yes [provider]  Pseudoephedrine-APAP-DM (DAYQUIL MULTI-SYMPTOM COLD/FLU PO) Take by mouth.   Yes [provider]    Family History History reviewed. No pertinent family history.  Social History Social History   Tobacco Use   Smoking status: Some Days   Smokeless tobacco: Never  Vaping Use   Vaping status: Every Day  Substance Use Topics   Alcohol use: Yes    Comment: occas   Drug use: Not Currently     Allergies   Patient has no known  allergies.   Review of Systems Review of Systems  Constitutional: Negative.   HENT:  Positive for congestion, rhinorrhea and sinus pressure.   Respiratory:  Positive for cough.   Gastrointestinal: Negative.   Musculoskeletal: Negative.   Psychiatric/Behavioral: Negative.       Physical Exam Triage Vital Signs ED Triage Vitals  Encounter Vitals Group     BP 07/14/23 1626 129/78     Systolic BP Percentile --      Diastolic BP Percentile --      Pulse Rate 07/14/23 1626 81     Resp 07/14/23 1626 16     Temp 07/14/23 1626 98.3 F (36.8 C)     Temp Source 07/14/23 1626 Oral     SpO2 07/14/23 1626 96 %     Weight 07/14/23 1627 275 lb (124.7 kg)     Height 07/14/23 1627 5\' 11"  (1.803 m)     Head Circumference --      Peak Flow --      Pain Score 07/14/23 1627 3     Pain Loc --      Pain Education --      Exclude from Growth Chart --    No data found.  Updated Vital Signs BP 129/78 (BP Location: Left Arm)   Pulse 81   Temp 98.3  F (36.8 C) (Oral)   Resp 16   Ht 5\' 11"  (1.803 m)   Wt 124.7 kg   SpO2 96%   BMI 38.35 kg/m   Visual Acuity Right Eye Distance:   Left Eye Distance:   Bilateral Distance:    Right Eye Near:   Left Eye Near:    Bilateral Near:     Physical Exam Constitutional:      Appearance: Normal appearance.  HENT:     Nose: Congestion and rhinorrhea present.     Right Sinus: Maxillary sinus tenderness present.     Left Sinus: Maxillary sinus tenderness present.     Mouth/Throat:     Mouth: Mucous membranes are moist.  Cardiovascular:     Rate and Rhythm: Normal rate and regular rhythm.  Pulmonary:     Effort: Pulmonary effort is normal.     Breath sounds: Normal breath sounds.  Musculoskeletal:     Cervical back: Normal range of motion and neck supple. No tenderness.  Neurological:     General: No focal deficit present.     Mental Status: He is alert.  Psychiatric:        Mood and Affect: Mood normal.      UC Treatments / Results   Labs (all labs ordered are listed, but only abnormal results are displayed) Labs Reviewed - No data to display  EKG   Radiology No results found.  Procedures Procedures (including critical care time)  Medications Ordered in UC Medications - No data to display  Initial Impression / Assessment and Plan / UC Course  I have reviewed the triage vital signs and the nursing notes.  Pertinent labs & imaging results that were available during my care of the patient were reviewed by me and considered in my medical decision making (see chart for details).    Final Clinical Impressions(s) / UC Diagnoses   Final diagnoses:  Acute non-recurrent maxillary sinusitis     Discharge Instructions      You were diagnosed with a sinus infection today.  I have sent out an antibiotic to take twice/day x 7 days.  I recommend over the counter claritin/zyrtec and flonase nasal spray . Please return if not improving or worsening.     ED Prescriptions     Medication Sig Dispense Auth. Provider   amoxicillin-clavulanate (AUGMENTIN) 875-125 MG tablet Take 1 tablet by mouth every 12 (twelve) hours. 14 tablet Jannifer Franklin, MD      PDMP not reviewed this encounter.   Jannifer Franklin, MD 07/14/23 1640

## 2023-07-14 NOTE — Discharge Instructions (Signed)
You were diagnosed with a sinus infection today.  I have sent out an antibiotic to take twice/day x 7 days.  I recommend over the counter claritin/zyrtec and flonase nasal spray . Please return if not improving or worsening.

## 2023-07-14 NOTE — ED Triage Notes (Signed)
Pt reports nasal congestion and productive cough x6 days. No fevers. White sputum with cough. Relief with OTC cold medicine. Needs work note for employer. Pt reports negative flu & covid tests.

## 2023-07-25 DIAGNOSIS — Z0000 Encounter for general adult medical examination without abnormal findings: Secondary | ICD-10-CM | POA: Diagnosis not present

## 2023-07-25 DIAGNOSIS — E291 Testicular hypofunction: Secondary | ICD-10-CM | POA: Diagnosis not present

## 2023-08-01 DIAGNOSIS — E291 Testicular hypofunction: Secondary | ICD-10-CM | POA: Diagnosis not present

## 2023-11-11 ENCOUNTER — Ambulatory Visit
Admission: RE | Admit: 2023-11-11 | Discharge: 2023-11-11 | Disposition: A | Discharge status: Home / Self Care | Source: Ambulatory Visit | Attending: Family Medicine | Admitting: Family Medicine

## 2023-11-11 ENCOUNTER — Encounter: Payer: Self-pay | Admitting: Emergency Medicine

## 2023-11-11 ENCOUNTER — Ambulatory Visit
Admission: EM | Admit: 2023-11-11 | Discharge: 2023-11-11 | Disposition: A | Discharge status: Home / Self Care | Attending: Family Medicine | Admitting: Family Medicine

## 2023-11-11 DIAGNOSIS — M25531 Pain in right wrist: Secondary | ICD-10-CM

## 2023-11-11 NOTE — ED Provider Notes (Signed)
 [facility]    CSN: [id number] Arrival date & time: 11/11/23  1309      History   Chief Complaint Chief Complaint  Patient presents with   Wrist Pain    HPI Jesus Blankenship is a 23 y.o. male.    Wrist Pain  Patient is here for right wrist pain after a fall last week.  He was on his skateboard.  He landed on his butt, and caught himself with his right hand.  He has had pain and swelling since.  He has been using a home brace, but not sure is helpful.         Past Medical History:  Diagnosis Date   ADHD    TBI (traumatic brain injury) (HCC) 04/2017   w/LOC   Vertigo     Patient Active Problem List   Diagnosis Date Noted   Blunt head injury 05/13/2017    Past Surgical History:  Procedure Laterality Date   CRANIOTOMY Right 05/13/2017   Procedure: CRANIOTOMY HEMATOMA EVACUATION EPIDURAL ;  Surgeon: Shirlean Kelly, MD;  Location: [facility] OR;  Service: Neurosurgery;  Laterality: Right;   VENTRICULOSTOMY  05/13/2017   Procedure: Insertion of Intracranial pressure monitor;  Surgeon: Shirlean Kelly, MD;  Location: [facility] OR;  Service: Neurosurgery;;       Home Medications    Prior to Admission medications   Medication Sig Start Date End Date Taking? Authorizing Provider  AUVELITY 45-105 MG TBCR Take 1 tablet by mouth every morning. 10/18/23  Yes [provider]  methocarbamol (ROBAXIN) 750 MG tablet Take 750 mg by mouth every 4 (four) hours. 08/01/23  Yes [provider]  naproxen (NAPROSYN) 500 MG tablet SMARTSIG:1 Tablet(s) By Mouth Every 12 Hours PRN 08/01/23  Yes [provider]  testosterone cypionate (DEPOTESTOSTERONE CYPIONATE) 200 MG/ML injection 0.5ML INTRAMUSCULAR EVERY 2 WEEKS 90 DAYS 08/01/23  Yes [provider]  amoxicillin-clavulanate (AUGMENTIN) 875-125 MG tablet Take 1 tablet by mouth every 12 (twelve) hours. 07/14/23   Jannifer Franklin, MD  DM-Doxylamine-Acetaminophen (NYQUIL COLD & FLU PO) Take by mouth.     [provider]  Pseudoephedrine-APAP-DM (DAYQUIL MULTI-SYMPTOM COLD/FLU PO) Take by mouth.    [provider]    Family History History reviewed. No pertinent family history.  Social History Social History   Tobacco Use   Smoking status: Some Days   Smokeless tobacco: Never  Vaping Use   Vaping status: Every Day   Substances: Nicotine  Substance Use Topics   Alcohol use: Yes    Comment: occas   Drug use: Not Currently     Allergies   Amphetamine-dextroamphetamine, Atomoxetine, Guanfacine, Lisdexamfetamine, and Methylphenidate   Review of Systems Review of Systems  Constitutional: Negative.   HENT: Negative.    Respiratory: Negative.    Cardiovascular: Negative.   Gastrointestinal: Negative.   Musculoskeletal:  Positive for arthralgias and joint swelling.     Physical Exam Triage Vital Signs ED Triage Vitals  Encounter Vitals Group     BP 11/11/23 1346 103/72     Systolic BP Percentile --      Diastolic BP Percentile --      Pulse Rate 11/11/23 1346 97     Resp 11/11/23 1346 18     Temp 11/11/23 1346 99.2 F (37.3 C)     Temp Source 11/11/23 1346 Oral     SpO2 11/11/23 1346 98 %     Weight 11/11/23 1341 274 lb 14.6 oz (124.7 kg)     Height  11/11/23 1341 5\' 11"  (1.803 m)     Head Circumference --      Peak Flow --      Pain Score 11/11/23 1341 4     Pain Loc --      Pain Education --      Exclude from Growth Chart --    No data found.  Updated Vital Signs BP 103/72 (BP Location: Right Arm)   Pulse 97   Temp 99.2 F (37.3 C) (Oral)   Resp 18   Ht 5\' 11"  (1.803 m)   Wt 124.7 kg   SpO2 98%   BMI 38.34 kg/m   Visual Acuity Right Eye Distance:   Left Eye Distance:   Bilateral Distance:    Right Eye Near:   Left Eye Near:    Bilateral Near:     Physical Exam Constitutional:      Appearance: Normal appearance. He is normal weight.  Musculoskeletal:     Comments: There is slight swelling to the right wrist;  He has TTP to  the distal ulna, and slightly at the wrist;  Decreased rom in all directions  Neurological:     Mental Status: He is alert.      UC Treatments / Results  Labs (all labs ordered are listed, but only abnormal results are displayed) Labs Reviewed - No data to display  EKG   Radiology DG Wrist Complete Right Result Date: 11/11/2023 CLINICAL DATA:  Wrist pain after fall 1 week ago. EXAM: RIGHT WRIST - COMPLETE 3+ VIEW COMPARISON:  None Available. FINDINGS: There is no evidence of acute or healing fracture. Normal alignment. No dislocation. There is no evidence of arthropathy or other focal bone abnormality. Soft tissues are unremarkable. IMPRESSION: Negative radiographs of the right wrist. Electronically Signed   By: Narda Rutherford M.D.   On: 11/11/2023 15:51    Procedures Procedures (including critical care time)  Medications Ordered in UC Medications - No data to display  Initial Impression / Assessment and Plan / UC Course  I have reviewed the triage vital signs and the nursing notes.  Pertinent labs & imaging results that were available during my care of the patient were reviewed by me and considered in my medical decision making (see chart for details).  Called patient about negative wrist xray results.  He is aware and will follow up as directed below.   Final Clinical Impressions(s) / UC Diagnoses   Final diagnoses:  Right wrist pain     Discharge Instructions      You were seen today for wrist pain after a fall.  I have ordered an xray for you today.  Please go to [facility] at [address] [place], [place] [address] [phone]) to get this done today.  I call you after I look at the xray for preliminary results.  Radiology will read this in the next 24 hrs.  I have given you a wrist splint today for comfort.  If the xray shows a fracture, you will need to follow up with an orthopedist, such as [facility] at [phone].  If negative, use the brace, use  ice, and motrin for pain.  If your pain persists with negative xray please follow up with your primary care provider.    ED Prescriptions   None    PDMP not reviewed this encounter.   Jannifer Franklin, MD 11/11/23 [phone]

## 2023-11-11 NOTE — Discharge Instructions (Signed)
 You were seen today for wrist pain after a fall.  I have ordered an xray for you today.  Please go to [facility] at [address] [place], [place] [address] [phone] to get this done today.  I call you after I look at the xray for preliminary results.  Radiology will read this in the next 24 hrs.  I have given you a wrist splint today for comfort.  If the xray shows a fracture, you will need to follow up with an orthopedist, such as [facility] at [phone].  If negative, use the brace, use ice, and motrin for pain.  If your pain persists with negative xray please follow up with your primary care provider.

## 2023-11-11 NOTE — ED Triage Notes (Signed)
 Pt say he injured his wrist while skateboarding last Wednesday. Says he landed on his wrist while trying to break a fall.

## 2023-12-02 DIAGNOSIS — E291 Testicular hypofunction: Secondary | ICD-10-CM | POA: Diagnosis not present

## 2024-02-13 DIAGNOSIS — F909 Attention-deficit hyperactivity disorder, unspecified type: Secondary | ICD-10-CM | POA: Diagnosis not present

## 2024-02-13 DIAGNOSIS — F418 Other specified anxiety disorders: Secondary | ICD-10-CM | POA: Diagnosis not present

## 2024-02-13 DIAGNOSIS — E291 Testicular hypofunction: Secondary | ICD-10-CM | POA: Diagnosis not present

## 2024-02-13 DIAGNOSIS — Z87820 Personal history of traumatic brain injury: Secondary | ICD-10-CM | POA: Diagnosis not present

## 2024-02-15 DIAGNOSIS — E291 Testicular hypofunction: Secondary | ICD-10-CM | POA: Diagnosis not present

## 2024-03-29 DIAGNOSIS — E291 Testicular hypofunction: Secondary | ICD-10-CM | POA: Diagnosis not present

## 2024-03-29 DIAGNOSIS — F909 Attention-deficit hyperactivity disorder, unspecified type: Secondary | ICD-10-CM | POA: Diagnosis not present

## 2024-03-29 DIAGNOSIS — F418 Other specified anxiety disorders: Secondary | ICD-10-CM | POA: Diagnosis not present

## 2024-05-15 DIAGNOSIS — R0981 Nasal congestion: Secondary | ICD-10-CM | POA: Diagnosis not present

## 2024-05-15 DIAGNOSIS — H903 Sensorineural hearing loss, bilateral: Secondary | ICD-10-CM | POA: Diagnosis not present

## 2024-05-15 DIAGNOSIS — J342 Deviated nasal septum: Secondary | ICD-10-CM | POA: Diagnosis not present

## 2024-06-06 DIAGNOSIS — M5417 Radiculopathy, lumbosacral region: Secondary | ICD-10-CM | POA: Diagnosis not present

## 2024-06-06 DIAGNOSIS — M9901 Segmental and somatic dysfunction of cervical region: Secondary | ICD-10-CM | POA: Diagnosis not present

## 2024-06-06 DIAGNOSIS — M9905 Segmental and somatic dysfunction of pelvic region: Secondary | ICD-10-CM | POA: Diagnosis not present

## 2024-06-06 DIAGNOSIS — M9904 Segmental and somatic dysfunction of sacral region: Secondary | ICD-10-CM | POA: Diagnosis not present

## 2024-06-06 DIAGNOSIS — M9903 Segmental and somatic dysfunction of lumbar region: Secondary | ICD-10-CM | POA: Diagnosis not present

## 2024-06-14 DIAGNOSIS — M25551 Pain in right hip: Secondary | ICD-10-CM | POA: Diagnosis not present

## 2024-06-14 DIAGNOSIS — M549 Dorsalgia, unspecified: Secondary | ICD-10-CM | POA: Diagnosis not present

## 2024-08-02 DIAGNOSIS — Z1331 Encounter for screening for depression: Secondary | ICD-10-CM | POA: Diagnosis not present

## 2024-08-02 DIAGNOSIS — F418 Other specified anxiety disorders: Secondary | ICD-10-CM | POA: Diagnosis not present

## 2024-08-02 DIAGNOSIS — M5459 Other low back pain: Secondary | ICD-10-CM | POA: Diagnosis not present

## 2024-08-02 DIAGNOSIS — Z0000 Encounter for general adult medical examination without abnormal findings: Secondary | ICD-10-CM | POA: Diagnosis not present

## 2024-08-02 DIAGNOSIS — G4710 Hypersomnia, unspecified: Secondary | ICD-10-CM | POA: Diagnosis not present

## 2024-08-02 DIAGNOSIS — E291 Testicular hypofunction: Secondary | ICD-10-CM | POA: Diagnosis not present

## 2024-08-21 DIAGNOSIS — R051 Acute cough: Secondary | ICD-10-CM | POA: Diagnosis not present

## 2024-08-21 DIAGNOSIS — R509 Fever, unspecified: Secondary | ICD-10-CM | POA: Diagnosis not present
